# Patient Record
Sex: Male | Born: 1990 | Race: White | Hispanic: No | Marital: Single | Smoking: Never smoker
Health system: Southern US, Community
[De-identification: ages and names within clinical notes are randomized; demographics above are authoritative.]

## PROBLEM LIST (undated history)

## (undated) HISTORY — PX: COLON SURGERY: SHX602

## (undated) HISTORY — PX: APPENDECTOMY: SHX54

---

## 2014-06-20 ENCOUNTER — Emergency Department (HOSPITAL_COMMUNITY)
Admission: EM | Admit: 2014-06-20 | Discharge: 2014-06-20 | Attending: Emergency Medicine | Admitting: Emergency Medicine

## 2014-06-20 ENCOUNTER — Emergency Department (HOSPITAL_COMMUNITY): Admission: EM | Admit: 2014-06-20 | Discharge: 2014-06-20 | Discharge status: Against Medical Advice | Payer: Self-pay

## 2014-06-20 ENCOUNTER — Emergency Department (HOSPITAL_COMMUNITY)

## 2014-06-20 ENCOUNTER — Encounter (HOSPITAL_COMMUNITY): Payer: Self-pay | Admitting: Emergency Medicine

## 2014-06-20 ENCOUNTER — Emergency Department (HOSPITAL_COMMUNITY)
Admission: EM | Admit: 2014-06-20 | Discharge: 2014-06-20 | Discharge status: Against Medical Advice | Payer: Self-pay | Attending: Emergency Medicine | Admitting: Emergency Medicine

## 2014-06-20 DIAGNOSIS — S02400A Malar fracture unspecified, initial encounter for closed fracture: Secondary | ICD-10-CM | POA: Insufficient documentation

## 2014-06-20 DIAGNOSIS — S02609A Fracture of mandible, unspecified, initial encounter for closed fracture: Secondary | ICD-10-CM | POA: Insufficient documentation

## 2014-06-20 DIAGNOSIS — S02401A Maxillary fracture, unspecified, initial encounter for closed fracture: Secondary | ICD-10-CM | POA: Insufficient documentation

## 2014-06-20 MED ORDER — ONDANSETRON HCL 4 MG/2ML IJ SOLN
4.0000 mg | Freq: Once | INTRAMUSCULAR | Status: AC
  Administered 2014-06-20: 4 mg via INTRAVENOUS
  Filled 2014-06-20: qty 2

## 2014-06-20 MED ORDER — ONDANSETRON 4 MG PO TBDP: 4.0000 mg | ORAL_TABLET | Freq: Three times a day (TID) | ORAL | Status: AC | PRN

## 2014-06-20 MED ORDER — HYDROCODONE-ACETAMINOPHEN 5-325 MG PO TABS: 1.0000 | ORAL_TABLET | ORAL | Status: AC | PRN

## 2014-06-20 MED ORDER — FENTANYL CITRATE 0.05 MG/ML IJ SOLN
50.0000 ug | INTRAMUSCULAR | Status: DC | PRN
  Administered 2014-06-20: 50 ug via INTRAVENOUS
  Filled 2014-06-20: qty 2

## 2014-06-20 NOTE — ED Notes (Signed)
Report called to triage nurse at [facility], nurse was uncomfortable receiving pt. And advised me to [facility]. [facility], pt. Is to be received by Dr. Renette ButtersSen at [facility]. Report given to nurse Steward RN.

## 2014-06-20 NOTE — Discharge Instructions (Signed)
You have fractures of your nose, your left maxillary sinus, and your right mandible. You will need to eat only foods that you do not have to chew, or a liquid diet. He is given followup information to call our ear nose and throat doctor for recheck appointment.  Facial Fracture A facial fracture is a break in one of the bones of your face. HOME CARE INSTRUCTIONS   Protect the injured part of your face until it is healed.  Do not participate in activities which give chance for re-injury until your doctor approves.  Gently wash and dry your face.  Wear head and facial protection while riding a bicycle, motorcycle, or snowmobile. SEEK MEDICAL CARE IF:   An oral temperature above 102 F (38.9 C) develops.  You have severe headaches or notice changes in your vision.  You have new numbness or tingling in your face.  You develop nausea (feeling sick to your stomach), vomiting or a stiff neck. SEEK IMMEDIATE MEDICAL CARE IF:   You develop difficulty seeing or experience double vision.  You become dizzy, lightheaded, or faint.  You develop trouble speaking, breathing, or swallowing.  You have a watery discharge from your nose or ear. MAKE SURE YOU:   Understand these instructions.  Will watch your condition.  Will get help right away if you are not doing well or get worse. Document Released: 12/09/2005 Document Revised: 03/02/2012 Document Reviewed: 07/28/2008 [facility] Patient Information 2015 [place], [place]. This information is not intended to replace advice given to you by your health care provider. Make sure you discuss any questions you have with your health care provider.  Fractured-Jaw Meal Plan The purpose of the fractured-jaw meal plan is to provide foods that can be easily blended and easily swallowed. This plan is typically used after jaw or mouth surgery, wired jaw surgery, or dental surgery. Foods in this plan need to be blended so that they can be sipped from a  straw or given through a syringe. You should try to have at least three meals and three snacks daily. It is important to make sure you get enough calories and protein to prevent weight loss and help your body heal, especially after surgery. You may wish to include a liquid multivitamin in your plan to ensure that you get all the vitamins and minerals you need. Ask your health care provider for a recommendation.  HOW DO I PREPARE MY MEALS? All foods in this plan must be blended. Avoid nuts, seeds, skins, peels, bones, or any foods that cannot be blended to the right consistency. Make sure to eat a variety of foods from each food group every day. The following tips can help you as you blend your food:  Remove skins, seeds, and peels from food.  Cook meats and vegetables thoroughly.  Cut foods into small pieces and mix with a small amount of liquid in a food processor or blender. Continue to add liquid until the food becomes thin enough to sip through a straw.  Adding liquids such as juice, milk, cream, broth, gravy, or vegetable juice can help add flavor to foods.  Heat foods after they have been blended to reduce the amount of foam created from blending.  Heat or cool your foods to lukewarm temperatures if your teeth and mouth are sensitive to extreme temperatures. WHAT FOODS CAN I EAT? Make sure to eat a variety of foods from each food group.  Grains  Hot cereals, such as oatmeal, grits, ground wheat cereals, and  polenta.  Rice and pasta.  Couscous. Vegetables  All cooked or canned vegetables, without seeds and skins.  Vegetable juices.  Cooked potatoes, without skins. Fruit  Any cooked or canned fruits, without seeds and skins.  Fresh, peeled soft fruits, such as bananas and peaches, that can be blended until smooth.  All fruit juices, without seeds and skins. Meat and Other Protein Sources  Soft-boiled eggs, scrambled eggs, powdered eggs, pasteurized egg mixtures, and  custard.  Ground meats, such as hamburger, Mal[place]urkey, sausage, and meatloaf.  Tender, well-cooked meat, poultry, and fish prepared without bones or skin.  Soft soy foods (such as tofu).  Smooth nut butters. Dairy  All are allowed. Beverages  Coffee (regular or decaffeinated), tea, and mineral water. Condiments  All seasonings and condiments that blend well. WHEN MAY I NEED TO SUPPLEMENT MY MEALS? If you begin to lose weight on this plan, you may need to increase the amount of food you are eating or the number of calories in your food or both. You can increase the number of calories by adding any of the following foods:  Protein powder or powdered milk.  Extra fats, such as margarine (without trans fat), sour cream, cream cheese, cream, and nut butters, such as peanut butter or almond butter.  Sweets, such as honey, ice cream, blackstrap molasses, or sugar. Document Released: 05/29/2010 Document Revised: 12/14/2013 Document Reviewed: 11/05/2013 [facility] Patient Information 2015 [place], [place]. This information is not intended to replace advice given to you by your health care provider. Make sure you discuss any questions you have with your health care provider.  Mandibular Fracture  A mandibular fracture is a break in the jawbone. Surgery is often needed to put the jaw back in the right position. Wires may be placed around the teeth to hold the jaw in place while it heals. HOME CARE  Put ice on the injured area.  Put ice in a plastic bag.  Place a towel between your skin and the bag.  Leave the ice on for 15-20 minutes, 03-04 times a day. Do this for the first 2 days.  Only take medicines as told by your doctor.  Eat soft or liquid foods as told by your doctor. Eat plenty of protein.  If your jaws are wired, follow your doctor's directions for wired jaw care.  Sleep on your back to avoid putting pressure on your jaw.  Avoid exercising so hard that you become short of  breath. GET HELP RIGHT AWAY IF:  You have a fever.  You have trouble breathing.  You feel like your airway is tight.  You cannot swallow your spit (saliva).  You make a high-pitched whistling sound when you breathe (wheezing).  You have a bad headache or lose feeling in your face (numbness).  You have bad jaw pain that does not get better with medicine.  Your jaw wires become loose.  You feel sick to your stomach (nauseous) or worried (anxious).  Your puffiness (swelling) or redness gets worse. MAKE SURE YOU:  Understand these instructions.  Will watch your condition.  Will get help right away if you are not doing well or get worse. Document Released: 03/02/2012 Document Reviewed: 03/02/2012 [facility] Patient Information 2015 [place], [place]. This information is not intended to replace advice given to you by your health care provider. Make sure you discuss any questions you have with your health care provider.
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
## 2014-06-20 NOTE — ED Notes (Signed)
Pt vomiting. edp aware.  
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
## 2015-02-02 IMAGING — CT CT MAXILLOFACIAL W/O CM
4 of 8 series · 15 of 47 positions shown, 17 images · non-contrast
Comparison: None.

CLINICAL DATA: Loss of consciousness.  Headaches.

Laceration under left eye.  Left facial swelling.  Right jaw pain.
EXAM:
CT HEAD WITHOUT CONTRAST
CT MAXILLOFACIAL WITHOUT CONTRAST
CT CERVICAL SPINE WITHOUT CONTRAST
TECHNIQUE: Multidetector CT imaging of the head, cervical spine, and
maxillofacial structures were performed using the standard protocol
without intravenous contrast. Multiplanar CT image reconstructions
of the cervical spine and maxillofacial structures were also
generated.

[Series 6: max st coronal · coronal · 0.37mm/px · 3 of 80 slices shown]
[im 27/80  bone]
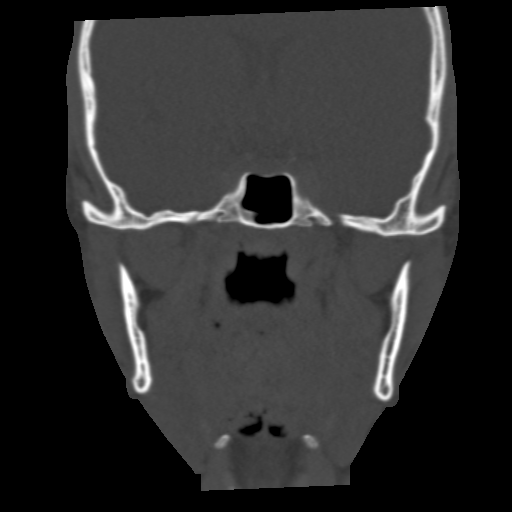
[im 40/80  bone]
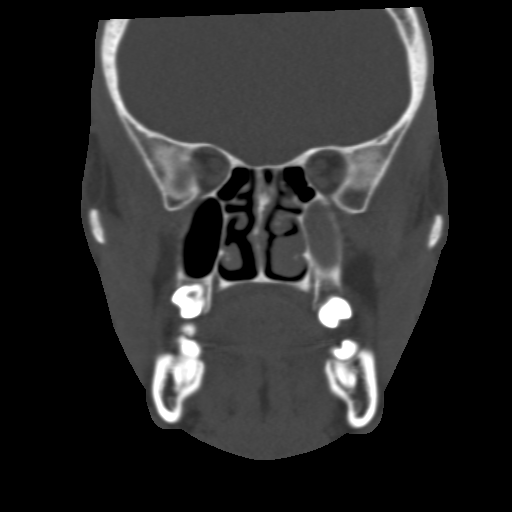
[im 53/80  bone]
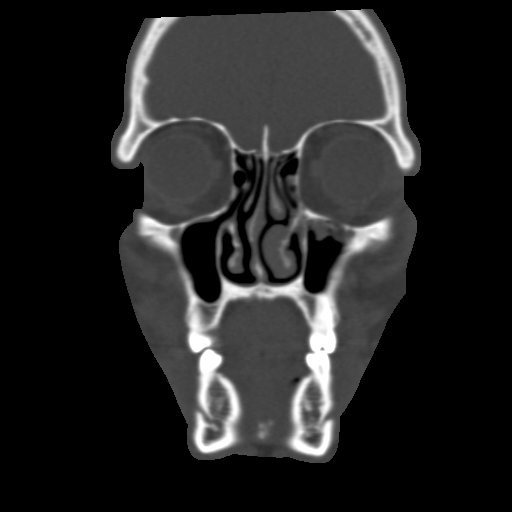

[Series 7: max st sagittal · sagittal · 0.32mm/px · 3 of 86 slices shown]
[im 22/86  bone]
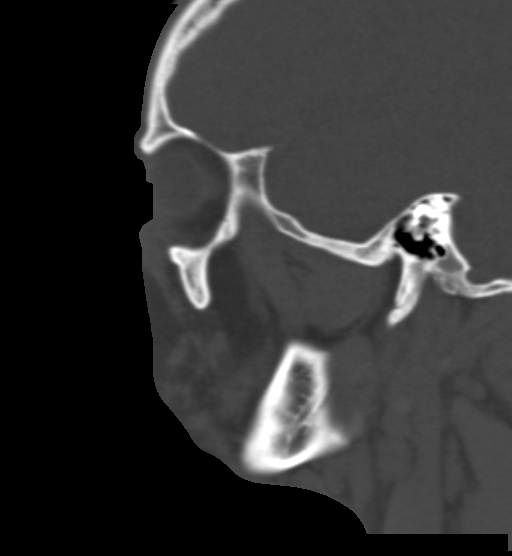
[im 43/86  bone]
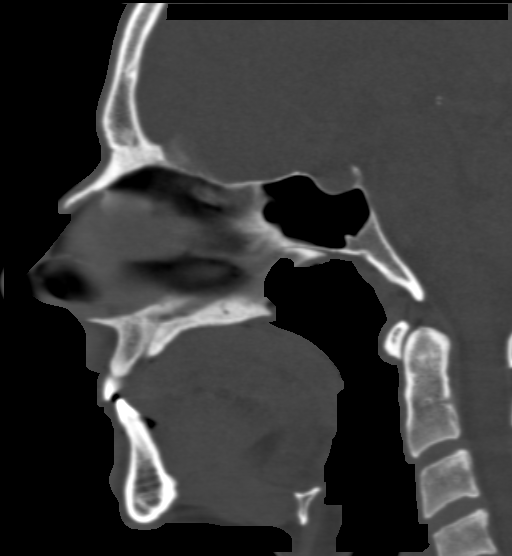
[im 64/86  bone]
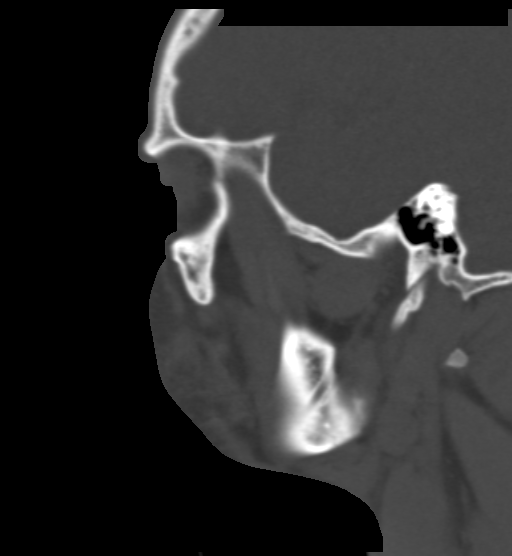

[Series 11: cervical st 2.0 b31s · axial · 0.27mm/px · z∈[+116,+142]mm · 2 of 94 slices shown]
[im 14/94  bone]
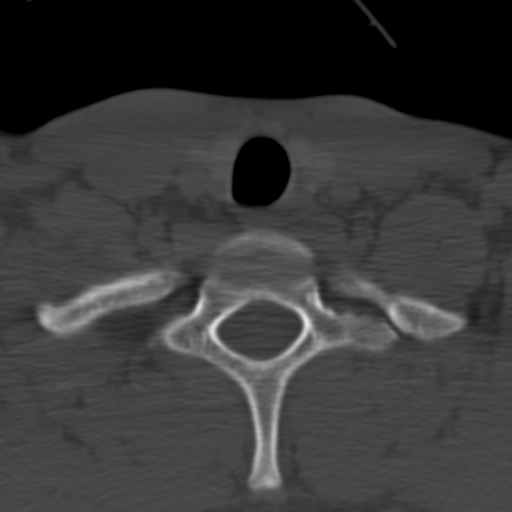
[im 27/94  bone]
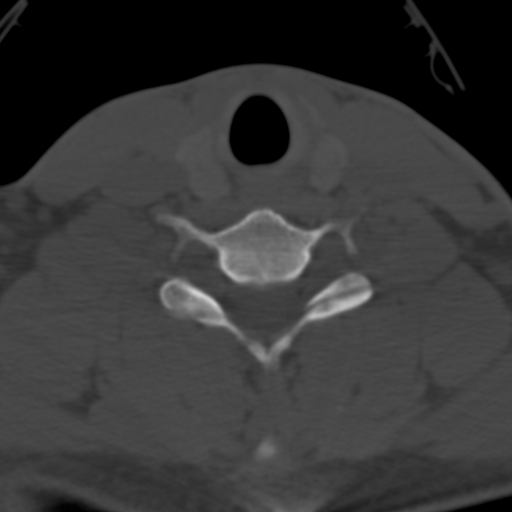

[Series 15: axial bone 2.0 · axial · 0.19mm/px · z∈[+96,+242]mm · 7 of 101 slices shown, 9 images]
[im 13/101  brain]
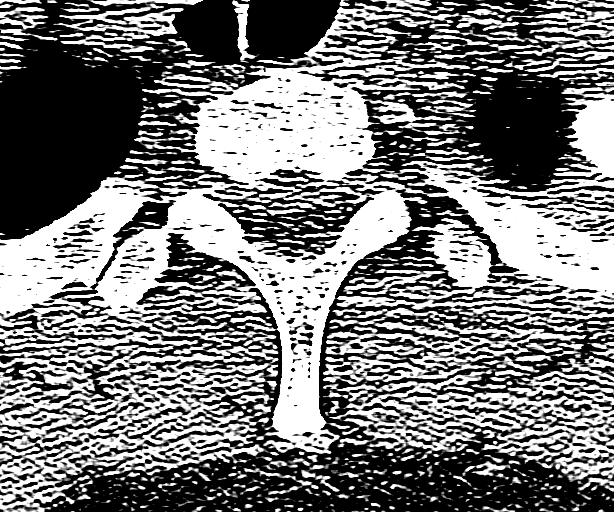
[im 13/101  bone]
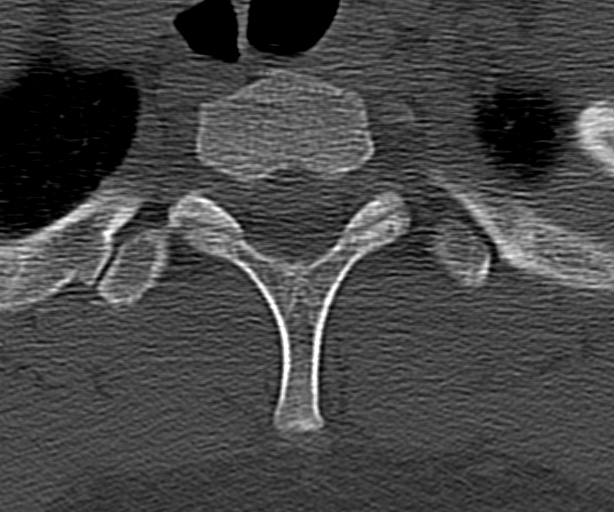
[im 26/101  bone]
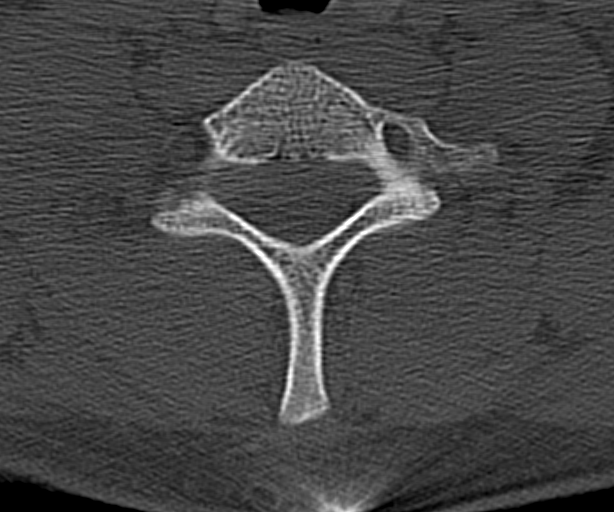
[im 38/101  bone]
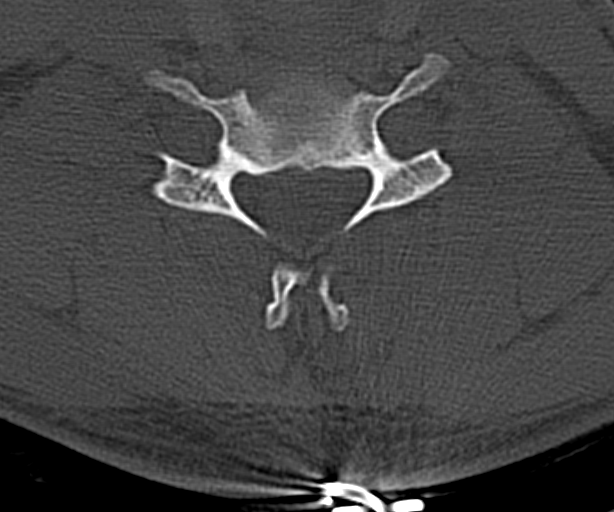
[im 51/101  bone]
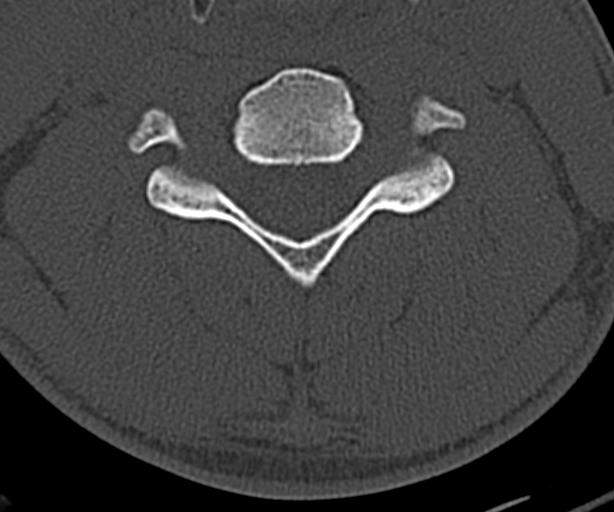
[im 63/101  brain]
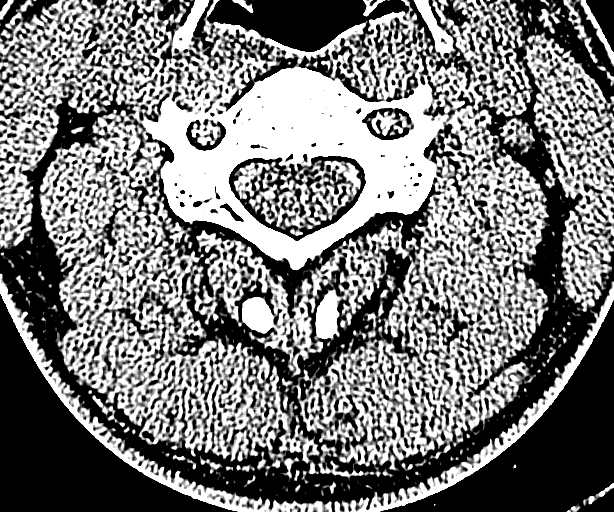
[im 63/101  bone]
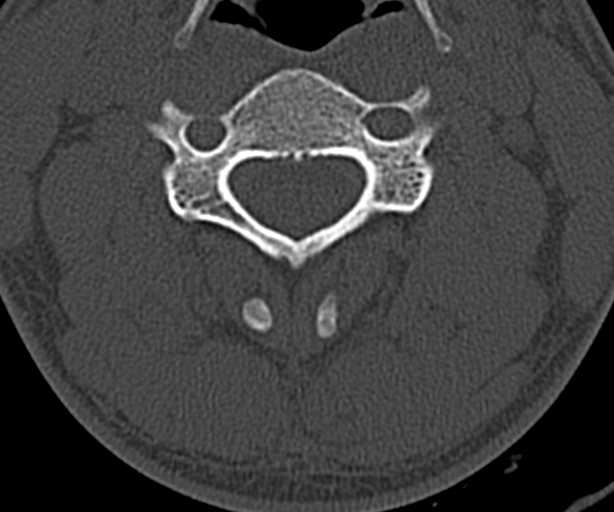
[im 76/101  bone]
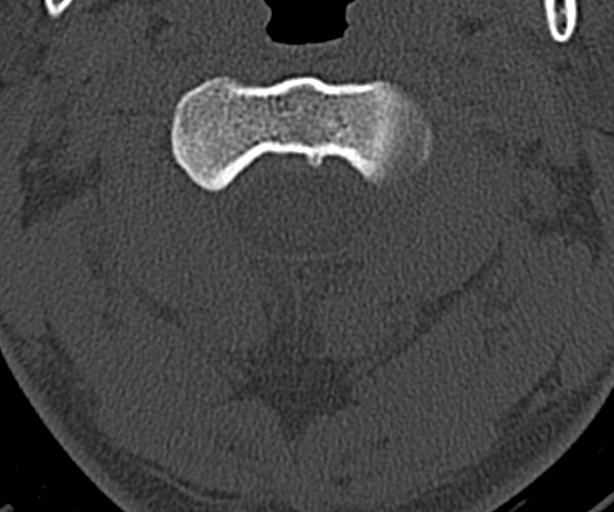
[im 88/101  bone]
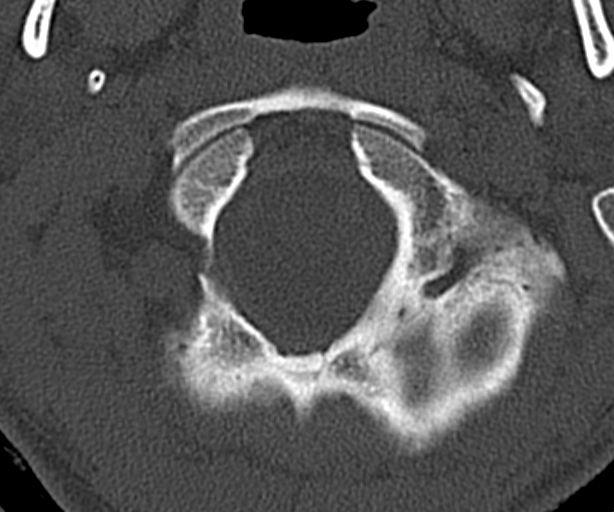

[15 of 47 positions shown; findings below may reference images not displayed]

FINDINGS: CT HEAD FINDINGS

There is no midline shift, hydrocephalus, or mass. No acute
hemorrhage or acute transcortical infarct is identified. There is
soft tissue swelling over left cheek. There is fracture of the
superior anterior left maxillary wall. See maxillofacial CT below
for details. The bony calvarium is otherwise intact.

CT MAXILLOFACIAL FINDINGS

There is mild displaced fracture of the left nasal bone. There is
comminuted displaced fracture of the superior anterior wall of the
left maxillary sinus. There is air-fluid levels within the left
maxillary sinus. There is no evidence herniation of the left
inferior rectus muscle. There is nondisplaced fracture of the right
angle mandible best seen on the sagittal images. The orbits are
normal. There is soft tissue swelling over the left cheek.

CT CERVICAL SPINE FINDINGS

There is no acute fracture dislocation of cervical spine. There is
straightening of cervical spine. The prevertebral soft tissues are
normal. The lung apices are clear.
IMPRESSION: No focal acute intracranial abnormality identified.

No acute fracture or dislocation of cervical spine.

Comminuted displaced fracture of the superior anterior wall of the
left maxillary sinus. There is no evidence of herniation of the left
inferior rectus muscle.

Mild displaced fracture of left nasal bone.

Nondisplaced fracture at the angle of right mandible
